# Patient Record
Sex: Male | Born: 1970 | Race: White | Hispanic: No | Marital: Single | Smoking: Former smoker
Health system: Southern US, Community
[De-identification: ages and names within clinical notes are randomized; demographics above are authoritative.]

## PROBLEM LIST (undated history)

## (undated) HISTORY — PX: HIP FRACTURE SURGERY: SHX118

## (undated) HISTORY — PX: ORIF FINGER / THUMB FRACTURE: SUR932

## (undated) HISTORY — PX: HERNIA REPAIR: SHX51

## (undated) HISTORY — PX: FRACTURE SURGERY: SHX138

---

## 2006-02-07 ENCOUNTER — Inpatient Hospital Stay (HOSPITAL_COMMUNITY): Admission: EM | Admit: 2006-02-07 | Discharge: 2006-02-09 | Payer: Self-pay | Admitting: Emergency Medicine

## 2007-02-22 ENCOUNTER — Encounter: Admission: RE | Admit: 2007-02-22 | Discharge: 2007-02-22 | Payer: Self-pay | Admitting: Family Medicine

## 2007-03-20 ENCOUNTER — Inpatient Hospital Stay (HOSPITAL_COMMUNITY): Admission: AC | Admit: 2007-03-20 | Discharge: 2007-03-27 | Payer: Self-pay

## 2007-04-23 ENCOUNTER — Ambulatory Visit (HOSPITAL_COMMUNITY): Admission: RE | Admit: 2007-04-23 | Discharge: 2007-04-23 | Payer: Self-pay | Admitting: Neurosurgery
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
## 2009-03-25 IMAGING — CR DG CLAVICLE*L*
2 series · 2 of 2 positions shown · non-contrast
Comparison: none

CLINICAL DATA: ORIF left clavicle. 
 LEFT CLAVICLE ? 2 VIEW:

[view not recorded (1 of 2)]
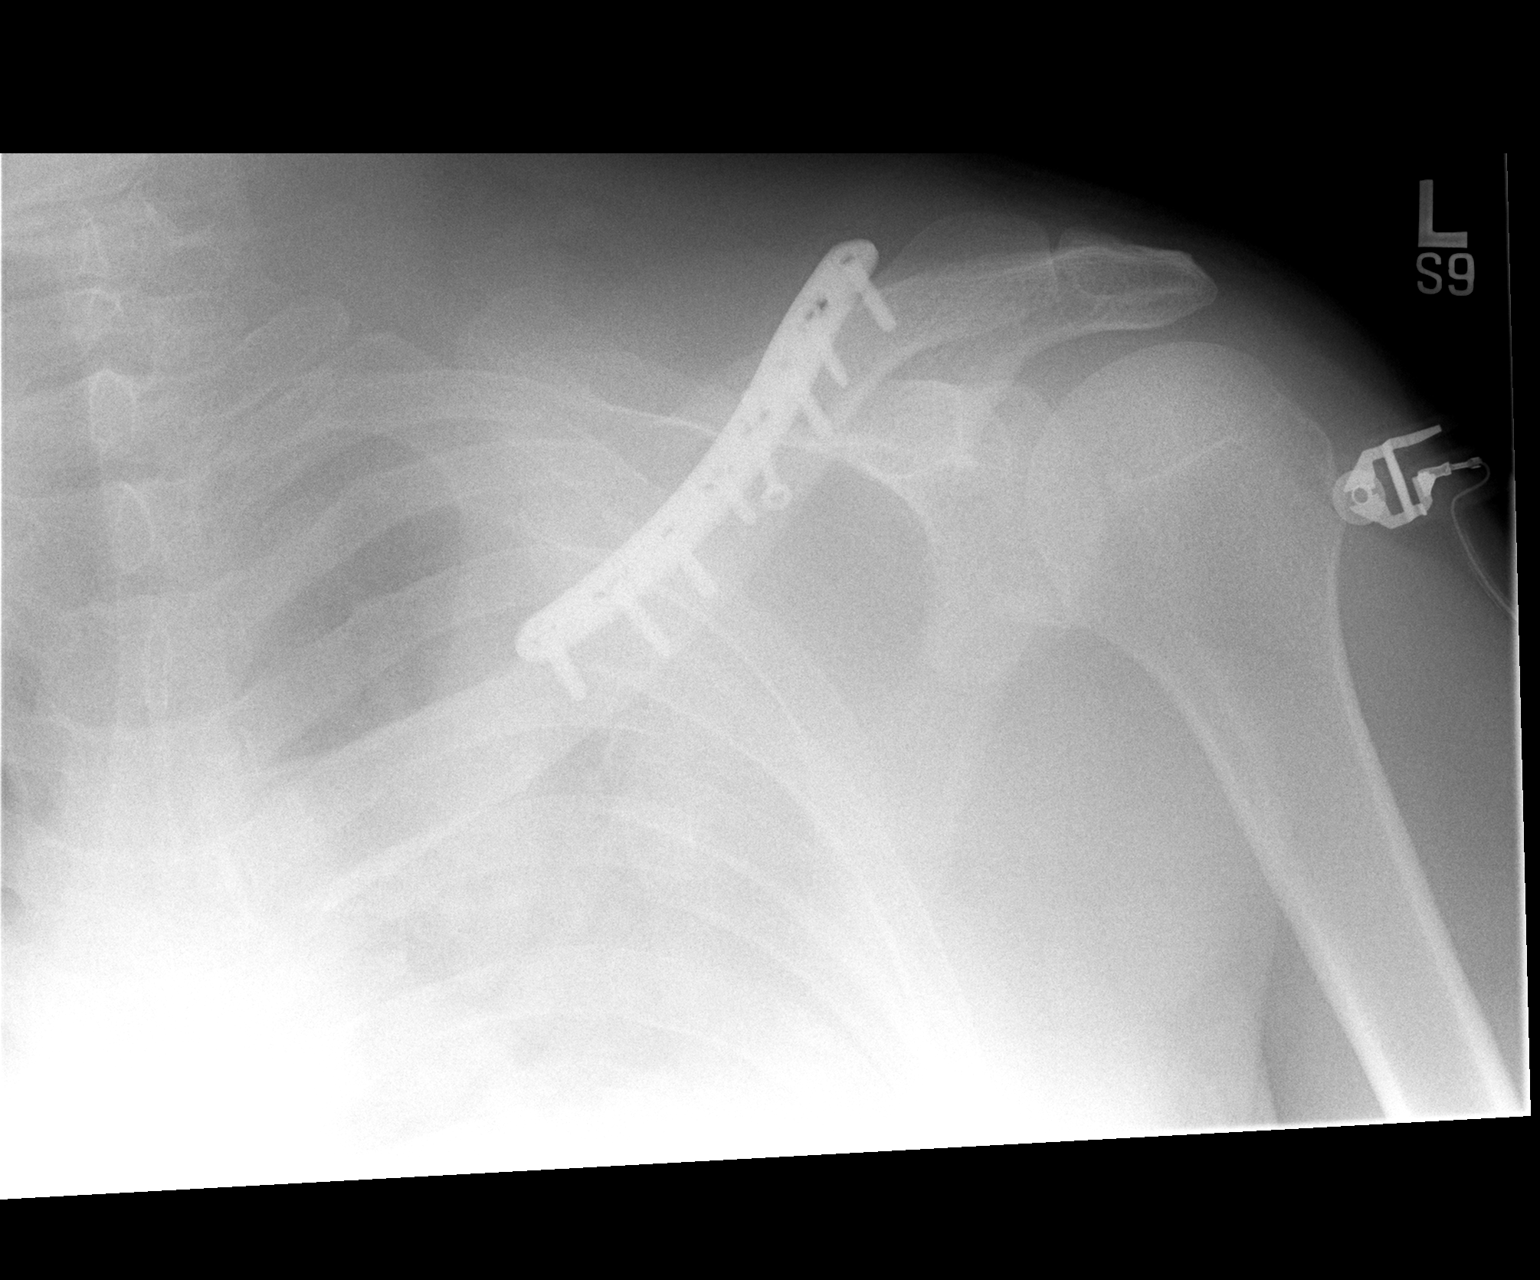

[view not recorded (2 of 2)]
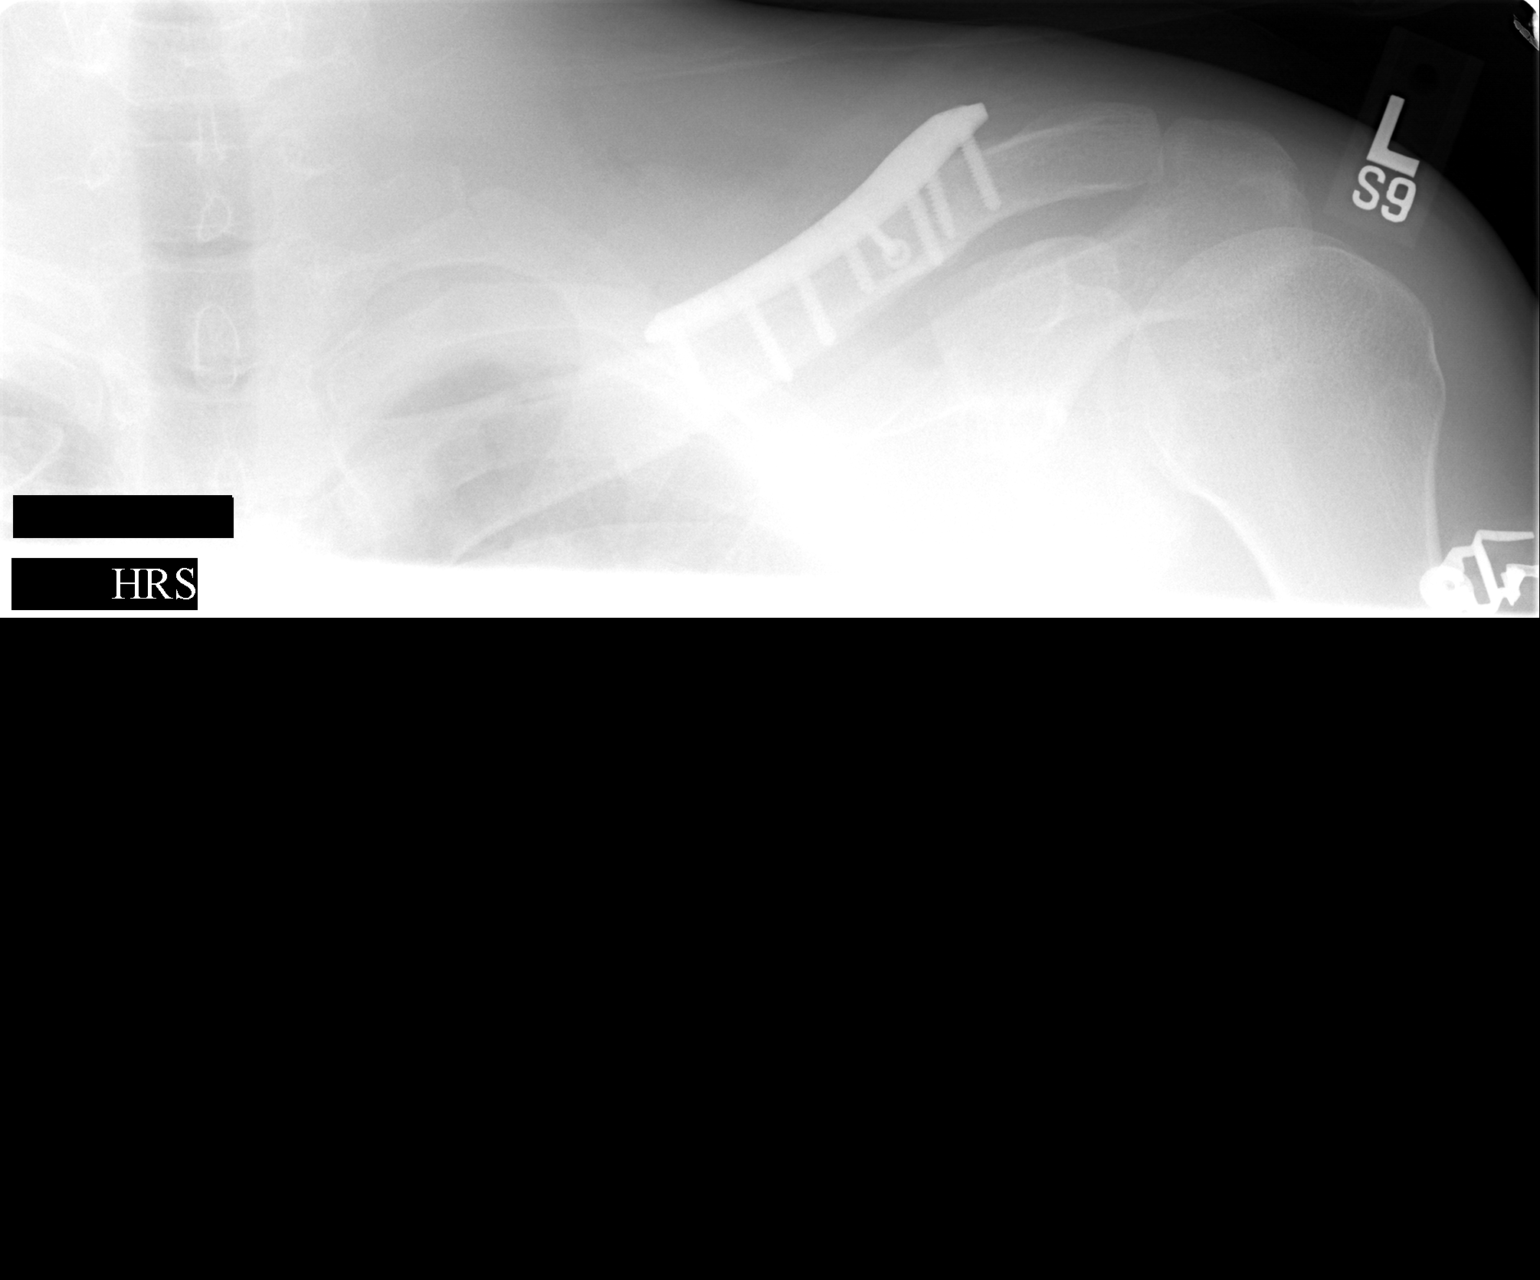

[2 of 2 positions shown; findings below may reference images not displayed]

FINDINGS: The patient has had ORIF with plate and screw fixation of a clavicle fracture.   Alignment appears anatomic.  No complicating features.
IMPRESSION: As discussed above.

## 2009-03-25 IMAGING — RF DG CLAVICLE*L*
1 series · 3 of 3 positions shown · non-contrast
Comparison: none

CLINICAL DATA: Left clavicle fracture. 
 LEFT CLAVICLE -3 VIEW:

[Series 1: run · 3 of 3 slices shown]
[im 1/3]
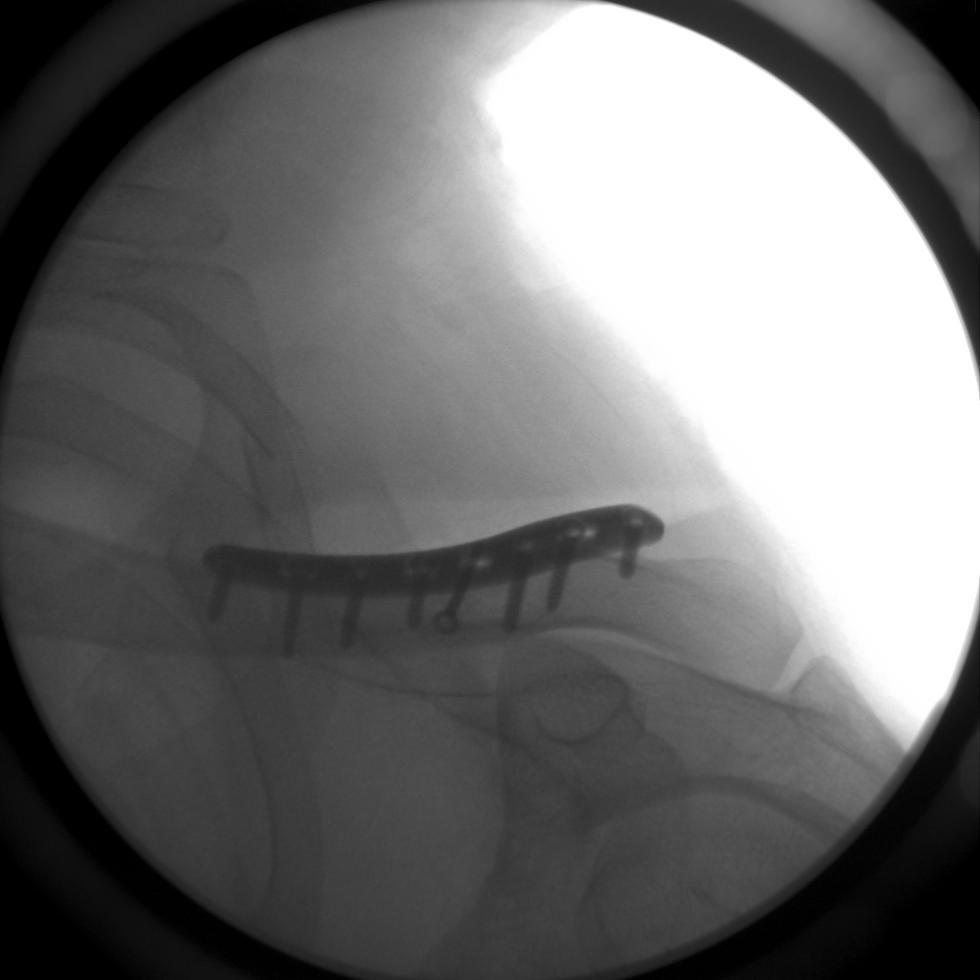
[im 2/3]
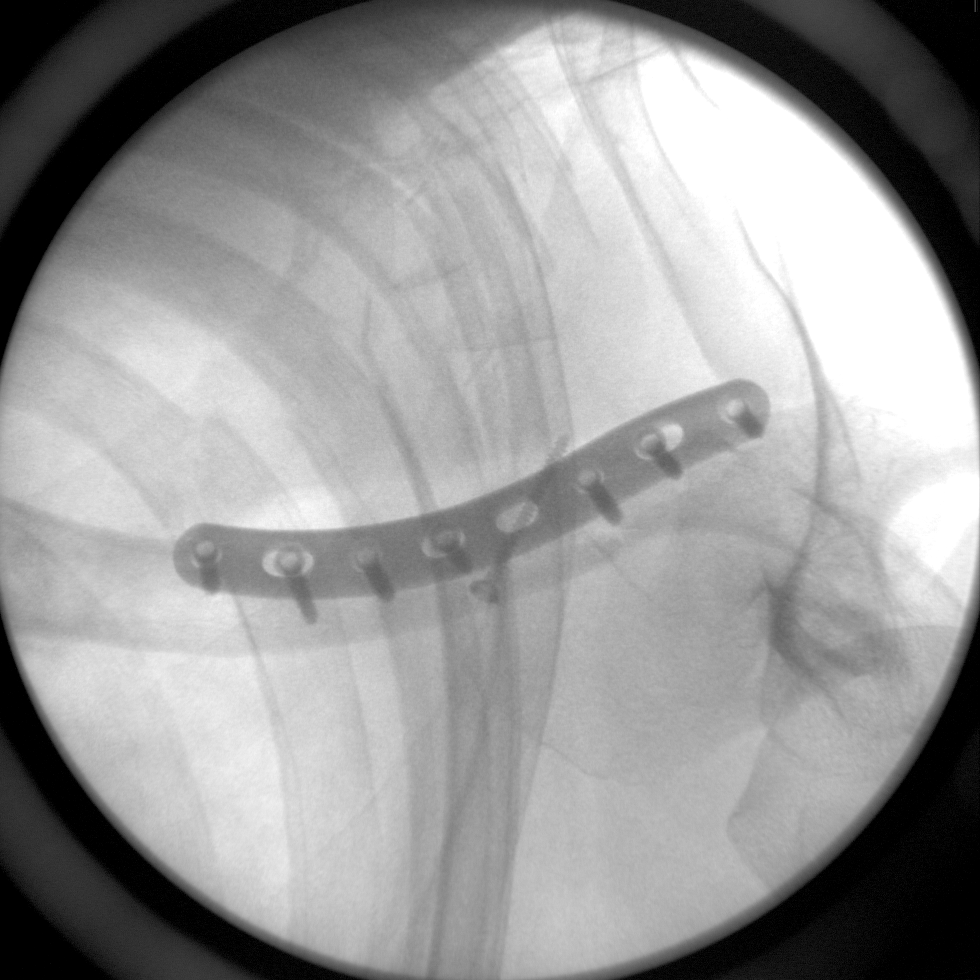
[im 3/3]
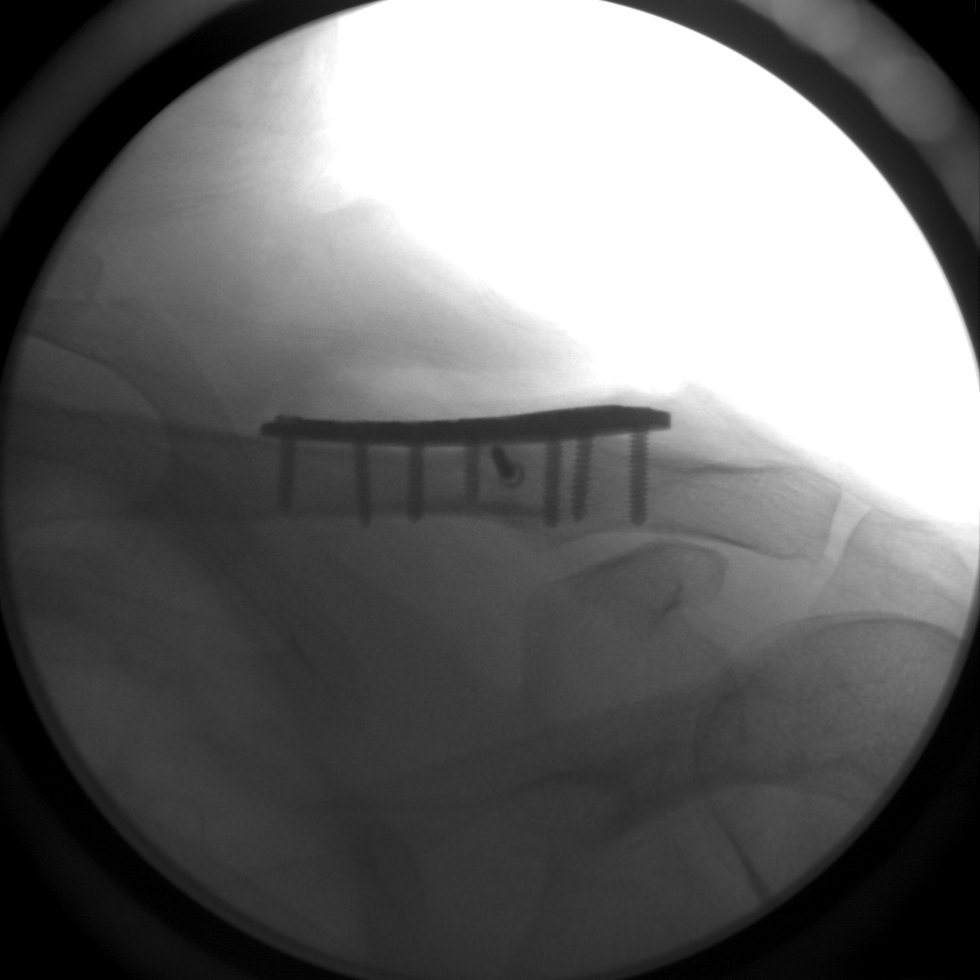

[3 of 3 positions shown; findings below may reference images not displayed]

FINDINGS: Fluoroscopic spot images obtained intraoperatively show placement of a fixation plate and screws across a distal clavicle fracture which is now in anatomic alignment.  
 Several left upper rib fractures are again noted.
IMPRESSION: Status post internal fixation of left clavicle fracture in anatomic alignment.

## 2010-08-12 ENCOUNTER — Encounter: Payer: Self-pay | Admitting: Neurosurgery
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
## 2010-12-03 NOTE — Op Note (Signed)
[name], [name]                ACCOUNT NO.:  [id number]   MEDICAL RECORD NO.:  [id number]          PATIENT TYPE:  INP   LOCATION:  5039                         FACILITY:  [facility]   PHYSICIAN:  Doralee Albino. Carola Frost, M.D. DATE OF BIRTH:  19-Aug-1970   DATE OF PROCEDURE:  03/25/2007  DATE OF DISCHARGE:                               OPERATIVE REPORT   PREOPERATIVE DIAGNOSIS:  Left clavicle fracture.   POSTOPERATIVE DIAGNOSIS:  Left clavicle fracture.   PROCEDURE:  Open reduction and internal fixation of left clavicle using  an Acumed plate.   SURGEON:  Doralee Albino. Carola Frost, M.D.   ASSISTANT:  None.   ANESTHESIA:  General.   SPECIMENS:  None.   ESTIMATED BLOOD LOSS:  Minimal.   COMPLICATIONS:  None.   DISPOSITION:  To the PACU.   CONDITION:  Stable.   INDICATIONS FOR PROCEDURE:  Seth [name] is a 40 year old male who  sustained a severely displaced left clavicle fracture with over 2 cm of  shortening.  We discussed preoperatively the risks and benefits of  surgery including the possibility of infection, nerve injury, vessel  injury, nonunion, symptomatic hardware, DVT, PE and others.  After a  full discussion, the patient wished to proceed.   DESCRIPTION OF PROCEDURE:  Mr. Laverdure was taken to the operating room.  After the administration of IV antibiotics, his left upper extremity was  prepped and draped in the usual sterile fashion around the clavicular  area.  A standard anterior approach was made and the muscle divided  while maintaining the periosteal layer of the clavicle. The fractured  ends were cleaned with curet and lavage.  There was significant  posterior translation of the distal segment as well as inferior  displacement and shortening.  We were able to pull this out to length  and hold it reduced with a pointed tenaculum. Multiple fluoro images  showed appropriate reduction.  There were no major butterfly segments  requiring dedicated internal fixation.  We then  placed an anterior to  posterior lag screw over drilling the near cortex.  We obtained  excellent compression of the fracture site, removed the tenaculum, and  proceeded with neutralization plating obtaining two locked and one  standard lateral bicortical screw and four medial ones.  Final AP and  lateral images showed appropriate reduction, hardware placement, and  length.  The wound was irrigated and closed in standard layered fashion  with 0 Vicryl for the deep fascial layer, 2-0 Vicryl for the subcu, and  running 3-0 Prolene and Steri-Strips for the skin.  A sterile gently  compressive dressing was applied and a sling placed, as well.  The  patient was taken to the PACU in stable condition.   PROGNOSIS:  Mr. Tanimoto should do well following his left clavicle repair.  He did have a traumatic abrasion in the area which was debrided at the  conclusion of the procedure as it began to peel up on the edges and it  is possible this may slightly increase his risk of infection, but again  should be minimal as it was thoroughly prepped and  debridement performed  under sterile conditions, as well.  He will be allowed early active and  passive range of motion of the shoulder at the side and can wean from  the sling after 3-4 weeks.  We do not anticipate any displacement of the  scapula.      Doralee Albino. Carola Frost, M.D.  Electronically Signed     MHH/MEDQ  D:  03/25/2007  T:  03/25/2007  Job:  [id number]
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
## 2011-05-02 LAB — CBC
HCT: 38.8 — ABNORMAL LOW
Hemoglobin: 14.4
Hemoglobin: 13.4
Hemoglobin: 15.3
MCHC: 35
MCV: 85.6
Platelets: 276
RBC: 4.46
RBC: 5.18
RDW: 13.2
WBC: 9.6
WBC: 9.7

## 2011-05-02 LAB — PROTIME-INR
INR: 1
Prothrombin Time: 13.7

## 2011-05-02 LAB — I-STAT 8, (EC8 V) (CONVERTED LAB)
Acid-base deficit: 5 — ABNORMAL HIGH
Bicarbonate: 21.4
Glucose, Bld: 138 — ABNORMAL HIGH
Hemoglobin: 16
Potassium: 3.6
Sodium: 136
TCO2: 23

## 2011-05-02 LAB — BASIC METABOLIC PANEL
BUN: 9
CO2: 33 — ABNORMAL HIGH
Calcium: 9.1
Chloride: 104
Creatinine, Ser: 0.78
GFR calc non Af Amer: >60
GFR calc non Af Amer: >60
Glucose, Bld: 108 — ABNORMAL HIGH
Potassium: 4.1
Sodium: 139
Sodium: 135

## 2011-05-02 LAB — SAMPLE TO BLOOD BANK

## 2011-09-01 ENCOUNTER — Ambulatory Visit (INDEPENDENT_AMBULATORY_CARE_PROVIDER_SITE_OTHER): Payer: BC Managed Care – PPO | Admitting: Physician Assistant

## 2011-09-01 VITALS — BP 125/68 | HR 78 | Temp 98.6°F | Resp 18 | Ht 73.0 in | Wt 205.0 lb

## 2011-09-01 DIAGNOSIS — J029 Acute pharyngitis, unspecified: Secondary | ICD-10-CM

## 2011-09-01 NOTE — Patient Instructions (Signed)
Upper Respiratory Infection, Adult An upper respiratory infection (URI) is also known as the common cold. It is often caused by a type of germ (virus). Colds are easily spread (contagious). You can pass it to others by kissing, coughing, sneezing, or drinking out of the same glass. Usually, you get better in 1 or 2 weeks.  HOME CARE   Only take medicine as told by your doctor.   Use a warm mist humidifier or breathe in steam from a hot shower.   Drink enough water and fluids to keep your pee (urine) clear or pale yellow.   Get plenty of rest.   Return to work when your temperature is back to normal or as told by your doctor. You may use a face mask and wash your hands to stop your cold from spreading.  GET HELP RIGHT AWAY IF:   After the first few days, you feel you are getting worse.   You have questions about your medicine.   You have chills, shortness of breath, or brown or red spit (mucus).   You have yellow or brown snot (nasal discharge) or pain in the face, especially when you bend forward.   You have a fever, puffy (swollen) neck, pain when you swallow, or white spots in the back of your throat.   You have a bad headache, ear pain, sinus pain, or chest pain.   You have a high-pitched whistling sound when you breathe in and out (wheezing).   You have a lasting cough or cough up blood.   You have sore muscles or a stiff neck.  MAKE SURE YOU:   Understand these instructions.   Will watch your condition.   Will get help right away if you are not doing well or get worse.  Document Released: 12/24/2007 Document Revised: 03/19/2011 Document Reviewed: 11/11/2010 [facility] Patient Information 2012 [place], [place].  Take Zinc every three hours until well (likely 36 hours) as well as Vitamin C 1000 mg twice daily.  Mucinex 1200mg  twice daily to thin mucous.  Drink plenty of fluids and rest as much as possible.

## 2011-09-01 NOTE — Progress Notes (Signed)
  Subjective:    Patient ID: Seth [name], male    DOB: 04-19-1971, 41 y.o.   MRN: [id number]  HPI Acute onset URI symptoms this afternoon.   Pressure feeling in head, "not over the top sinus".  Last sinusitis two years ago Difficulty concentrating d/t feeling "foggy headed" Tight feeling in throat, "right on the edge of illness". Slight nausea over the last hour.   Pressure left maxillary sinus x 6 hours. Not yet using OTC medications.  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  Loves it, but lots of work.    Review of Systems  Constitutional: Positive for fatigue. Negative for fever and chills.  HENT: Positive for ear pain, congestion, sore throat and sinus pressure. Negative for facial swelling, rhinorrhea, neck stiffness and postnasal drip.   Respiratory: Positive for cough (and clearing throat, started several hours ago). Negative for shortness of breath and wheezing.   Gastrointestinal: Positive for nausea (acute, while waiting in the lobby).  Musculoskeletal: Negative for myalgias and arthralgias.       Objective:   Physical Exam  Constitutional: He appears well-developed and well-nourished. No distress.       Does not look ill  HENT:  Head: Normocephalic and atraumatic.  Right Ear: External ear normal.  Left Ear: External ear normal.  Mouth/Throat: Oropharynx is clear and moist. No oropharyngeal exudate.  Eyes: EOM are normal. Pupils are equal, round, and reactive to light.  Neck: Normal range of motion. Neck supple.  Cardiovascular: Normal rate and regular rhythm.  Exam reveals no gallop and no friction rub.   No murmur heard. Pulmonary/Chest: Effort normal and breath sounds normal. No respiratory distress.  Lymphadenopathy:    He has no cervical adenopathy.          Assessment & Plan:  URI. Discussed at length with patient that all of his symptoms can be attributed to his viral illness.  Advised he use Mucinex 1200mg  BID to thin mucus as  well as drinking plenty of fluids and getting rest.  Due to acuity of his onset of illness, advised he use Zinc every three hours until symptoms abate, as well as vitamin C 1000mg  twice daily.  He may use ibuprofen or Tylenol prn aches or fever if they occur.   Reassurance that he will get better and does not need antibiotics.

## 2011-12-22 ENCOUNTER — Ambulatory Visit: Payer: BC Managed Care – PPO

## 2011-12-22 ENCOUNTER — Ambulatory Visit (INDEPENDENT_AMBULATORY_CARE_PROVIDER_SITE_OTHER): Payer: BC Managed Care – PPO | Admitting: Family Medicine

## 2011-12-22 VITALS — BP 152/83 | HR 60 | Temp 98.2°F | Resp 16 | Ht 74.5 in | Wt 203.0 lb

## 2011-12-22 DIAGNOSIS — R519 Headache, unspecified: Secondary | ICD-10-CM

## 2011-12-22 DIAGNOSIS — R51 Headache: Secondary | ICD-10-CM

## 2011-12-22 DIAGNOSIS — R040 Epistaxis: Secondary | ICD-10-CM

## 2011-12-22 MED ORDER — TRAMADOL HCL 50 MG PO TABS: 50.0000 mg | ORAL_TABLET | Freq: Three times a day (TID) | ORAL | Status: AC | PRN

## 2011-12-22 NOTE — Patient Instructions (Signed)
Hematoma   A hematoma is a pocket of blood that collects under the skin, in an organ, in a body space, in a joint space, or in other tissue. The blood can clot to form a lump that you can see and feel. The lump is often firm, sore, and sometimes even painful and tender. Most hematomas get better in a few days to weeks. However, some hematomas may be serious and require medical care. Hematomas can range in size from very small to very large.   CAUSES   A hematoma can be caused by a blunt or penetrating injury. It can also be caused by leakage from a blood vessel under the skin. Spontaneous leakage from a blood vessel is more likely to occur in elderly people, especially those taking blood thinners. Sometimes, a hematoma can develop after certain medical procedures.   SYMPTOMS   Unlike a bruise, a hematoma forms a firm lump that you can feel. This lump is the collection of blood. The collection of blood can also cause your skin to turn a blue to dark blue color. If the hematoma is close to the surface of the skin, it often produces a yellowish color in the skin.   DIAGNOSIS   Your caregiver can determine whether you have a hematoma based on your history and a physical exam.   TREATMENT   Hematomas usually go away on their own over time. Rarely does the blood need to be drained out of the body.   HOME CARE INSTRUCTIONS   Put ice on the injured area.   Put ice in a plastic bag.   Place a towel between your skin and the bag.   Leave the ice on for 15 to 20 minutes, 3 to 4 times a day for the first 1 to 2 days.   After the first 2 days, switch to using warm compresses on the hematoma.   Elevate the injured area to help decrease pain and swelling. Wrapping the area with an elastic bandage may also be helpful. Compression helps to reduce swelling and promotes shrinking of the hematoma. Make sure the bandage is not wrapped too tight.   If your hematoma is on a lower extremity and is painful, crutches may be helpful for a  couple days.   Only take over-the-counter or prescription medicines for pain, discomfort, or fever as directed by your caregiver. Most patients can take acetaminophen or ibuprofen for the pain.   SEEK IMMEDIATE MEDICAL CARE IF:   You have increasing pain, or your pain is not controlled with medicine.   You have a fever.   You have worsening swelling or discoloration.   Your skin over the hematoma breaks or starts bleeding.   MAKE SURE YOU:   Understand these instructions.   Will watch your condition.   Will get help right away if you are not doing well or get worse.   Document Released: 02/19/2004 Document Revised: 06/26/2011 Document Reviewed: 03/10/2011   [employer]® Patient Information ©2012 [employer].
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
## 2011-12-30 ENCOUNTER — Telehealth: Payer: Self-pay

## 2011-12-30 NOTE — Telephone Encounter (Signed)
Pt has questions about his last office visit and when he can resume normal activities
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
## 2011-12-30 NOTE — Telephone Encounter (Signed)
Pt.notified
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
## 2012-01-02 ENCOUNTER — Ambulatory Visit (INDEPENDENT_AMBULATORY_CARE_PROVIDER_SITE_OTHER): Payer: BC Managed Care – PPO | Admitting: Emergency Medicine

## 2012-01-02 VITALS — BP 124/75 | HR 79 | Temp 99.0°F | Resp 17 | Ht 74.5 in | Wt 198.0 lb

## 2012-01-02 DIAGNOSIS — J018 Other acute sinusitis: Secondary | ICD-10-CM

## 2012-01-02 DIAGNOSIS — J40 Bronchitis, not specified as acute or chronic: Secondary | ICD-10-CM

## 2012-01-02 DIAGNOSIS — J029 Acute pharyngitis, unspecified: Secondary | ICD-10-CM

## 2012-01-02 MED ORDER — AMOXICILLIN-POT CLAVULANATE ER 1000-62.5 MG PO TB12: 2.0000 | ORAL_TABLET | Freq: Two times a day (BID) | ORAL | Status: AC

## 2012-01-02 MED ORDER — PSEUDOEPHEDRINE-GUAIFENESIN ER 120-1200 MG PO TB12: 1.0000 | ORAL_TABLET | Freq: Two times a day (BID) | ORAL | Status: DC

## 2012-01-02 MED ORDER — HYDROCOD POLST-CHLORPHEN POLST 10-8 MG/5ML PO LQCR: 5.0000 mL | Freq: Two times a day (BID) | ORAL | Status: DC | PRN

## 2012-01-02 NOTE — Patient Instructions (Signed)

## 2012-01-02 NOTE — Progress Notes (Signed)
  Subjective:    Patient ID: Lean Fayson, male    DOB: 1970-08-14, 41 y.o.   MRN: [id number]  HPI    Review of Systems     Objective:   Physical Exam        Assessment & Plan:   Subjective:     Mamie Diiorio is a 41 y.o. male who presents for evaluation of sinus pain. Symptoms include: congestion, epistaxis, foul breath, frequent clearing of the throat, headaches, nasal congestion, purulent rhinorrhea, sinus pressure and sore throat. Onset of symptoms was 1 week ago. Symptoms have been gradually worsening since that time. Past history is significant for no history of pneumonia or bronchitis. Patient is a non-smoker.  The following portions of the patient's history were reviewed and updated as appropriate: allergies, current medications, past family history, past medical history, past social history, past surgical history and problem list.  Review of Systems A comprehensive review of systems was negative.   Objective:    BP 124/75  Pulse 79  Temp 99 F (37.2 C) (Oral)  Resp 17  Ht 6' 2.5" (1.892 m)  Wt 198 lb (89.812 kg)  BMI 25.08 kg/m2  SpO2 95%  General Appearance:    Alert, cooperative, no distress, appears stated age  Head:    Normocephalic, without obvious abnormality, atraumatic  Eyes:    PERRL, conjunctiva/corneas clear, EOM's intact, fundi    benign, both eyes       Ears:    Normal TM's and external ear canals, both ears  Nose:   Nares normal, septum midline, mucosa normal, no drainage    But moderate sinus tenderness  Throat:   Lips, mucosa, and tongue normal; teeth and gums normal  Neck:   Supple, symmetrical, trachea midline, no adenopathy;       thyroid:  No enlargement/tenderness/nodules; no carotid   bruit or JVD  Back:     Symmetric, no curvature, ROM normal, no CVA tenderness  Lungs:     Clear to auscultation bilaterally, respirations unlabored  Chest wall:    No tenderness or deformity  Heart:    Regular rate and rhythm, S1 and S2 normal, no  murmur, rub   or gallop  Abdomen:     Soft, non-tender, bowel sounds active all four quadrants,    no masses, no organomegaly  Genitalia:    Normal male without lesion, discharge or tenderness  Rectal:    Normal tone, normal prostate, no masses or tenderness;   guaiac negative stool  Extremities:   Extremities normal, atraumatic, no cyanosis or edema  Pulses:   2+ and symmetric all extremities  Skin:   Skin color, texture, turgor normal, no rashes or lesions  Lymph nodes:   Cervical, supraclavicular, and axillary nodes normal  Neurologic:   CNII-XII intact. Normal strength, sensation and reflexes      throughout      Assessment:    Acute bacterial sinusitis.    Plan:    Antihistamines per medication orders. Augmentin per medication orders. Follow up in 10 days or as needed.

## 2013-05-31 ENCOUNTER — Ambulatory Visit (INDEPENDENT_AMBULATORY_CARE_PROVIDER_SITE_OTHER): Payer: BC Managed Care – PPO | Admitting: Family Medicine

## 2013-05-31 VITALS — BP 109/74 | HR 81 | Temp 98.6°F | Resp 18 | Ht 74.0 in | Wt 226.0 lb

## 2013-05-31 DIAGNOSIS — R1310 Dysphagia, unspecified: Secondary | ICD-10-CM

## 2013-05-31 DIAGNOSIS — M542 Cervicalgia: Secondary | ICD-10-CM

## 2013-05-31 MED ORDER — PREDNISONE 20 MG PO TABS: ORAL_TABLET | ORAL | Status: DC
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
## 2013-06-01 NOTE — Progress Notes (Signed)
Left message for patient to call back regarding scheduling appointment for physical. °

## 2013-06-01 NOTE — Progress Notes (Signed)
Appointment made for 12/26.

## 2013-06-08 ENCOUNTER — Telehealth: Payer: Self-pay

## 2013-06-08 NOTE — Telephone Encounter (Signed)
Patient states prednisone not working and it is still painful. Would like to know what Dr. Elbert Ewings recommends.  Best [phone]

## 2013-06-09 ENCOUNTER — Other Ambulatory Visit: Payer: Self-pay | Admitting: Family Medicine

## 2013-06-09 DIAGNOSIS — M542 Cervicalgia: Secondary | ICD-10-CM

## 2013-07-15 ENCOUNTER — Encounter: Payer: BC Managed Care – PPO | Admitting: Family Medicine

## 2013-07-21 ENCOUNTER — Ambulatory Visit (INDEPENDENT_AMBULATORY_CARE_PROVIDER_SITE_OTHER): Payer: BC Managed Care – PPO | Admitting: Physician Assistant

## 2013-07-21 VITALS — BP 128/70 | HR 103 | Temp 99.4°F | Resp 16 | Ht 74.5 in | Wt 232.0 lb

## 2013-07-21 DIAGNOSIS — R05 Cough: Secondary | ICD-10-CM

## 2013-07-21 DIAGNOSIS — J101 Influenza due to other identified influenza virus with other respiratory manifestations: Secondary | ICD-10-CM

## 2013-07-21 DIAGNOSIS — J029 Acute pharyngitis, unspecified: Secondary | ICD-10-CM

## 2013-07-21 DIAGNOSIS — R059 Cough, unspecified: Secondary | ICD-10-CM

## 2013-07-21 DIAGNOSIS — J111 Influenza due to unidentified influenza virus with other respiratory manifestations: Secondary | ICD-10-CM

## 2013-07-21 LAB — POCT INFLUENZA A/B
INFLUENZA A, POC: POSITIVE
Influenza B, POC: NEGATIVE

## 2013-07-21 MED ORDER — HYDROCOD POLST-CHLORPHEN POLST 10-8 MG/5ML PO LQCR: 5.0000 mL | Freq: Two times a day (BID) | ORAL | Status: DC | PRN

## 2013-07-21 MED ORDER — IPRATROPIUM BROMIDE 0.03 % NA SOLN: 2.0000 | Freq: Two times a day (BID) | NASAL | Status: DC

## 2013-07-21 MED ORDER — GUAIFENESIN ER 1200 MG PO TB12: 1.0000 | ORAL_TABLET | Freq: Two times a day (BID) | ORAL | Status: DC | PRN

## 2013-07-21 MED ORDER — OSELTAMIVIR PHOSPHATE 75 MG PO CAPS: 75.0000 mg | ORAL_CAPSULE | Freq: Two times a day (BID) | ORAL | Status: AC

## 2013-07-21 NOTE — Patient Instructions (Signed)

## 2013-07-21 NOTE — Progress Notes (Signed)
   Subjective:    Patient ID: Seth [name], male    DOB: [birth year], 43 y.o.   MRN: [id number]  PCP: No primary provider on file.  Chief Complaint  Patient presents with  . Sore Throat    x 2 day  . URI   Medications, allergies, past medical history, surgical history, family history, social history and problem list reviewed and updated.  HPI  Had a flu vaccine this season.  Symptoms began 2 nights ago. 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  Felt a tickle in his throat and had an aggravating cough.  Cough kept him awake.  Has persisted since then.  Nasal congestion. Felt some frontal sinus pressure, but not as significant as previous sinusitis. No fever/chills. Has a back injury from time in the [employer], and is sore since he's been in bed x 2 days. Isn't sure if he feels "all body" aches.  No nausea, vomiting or diarrhea.  No ear symptoms.  Review of Systems As above.    Objective:   Physical Exam  Blood pressure 128/70, pulse 103, temperature 99.4 F (37.4 C), temperature source Oral, resp. rate 16, height 6' 2.5" (1.892 m), weight 232 lb (105.235 kg), SpO2 95.00%. Body mass index is 29.4 kg/(m^2). Well-developed, well nourished WM who is awake, alert and oriented, in NAD. Coughing frequently during interview/exam. HEENT: [place]/AT, PERRL, EOMI.  Sclera and conjunctiva are clear.  EAC are patent, TMs are normal in appearance. Nasal mucosa is pink and moist. OP is clear. Neck: supple, non-tender, no lymphadenopathy, thyromegaly. Heart: RRR, no murmur Lungs: normal effort, CTA Extremities: no cyanosis, clubbing or edema. Skin: warm and dry without rash. Psychologic: good mood and appropriate affect, normal speech and behavior.   Results for orders placed in visit on 07/21/13  POCT INFLUENZA A/B      Result Value Range   Influenza A, POC Positive     Influenza B, POC Negative         Assessment & Plan:  1. Influenza  A 2. Cough 3. Acute pharyngitis - POCT Influenza A/B - oseltamivir (TAMIFLU) 75 MG capsule; Take 1 capsule (75 mg total) by mouth 2 (two) times daily.  Dispense: 10 capsule; Refill: 0 - ipratropium (ATROVENT) 0.03 % nasal spray; Place 2 sprays into both nostrils 2 (two) times daily.  Dispense: 30 mL; Refill: 0 - Guaifenesin (MUCINEX MAXIMUM STRENGTH) 1200 MG TB12; Take 1 tablet (1,200 mg total) by mouth every 12 (twelve) hours as needed.  Dispense: 14 tablet; Refill: 1 - chlorpheniramine-HYDROcodone (TUSSIONEX PENNKINETIC ER) 10-8 MG/5ML LQCR; Take 5 mLs by mouth every 12 (twelve) hours as needed for cough (cough).  Dispense: 100 mL; Refill: 0  Supportive care.  Anticipatory guidance.  RTC if symptoms worsen/persist.   Fara Chute, PA-C Physician Assistant-Certified [facility]

## 2014-09-11 ENCOUNTER — Ambulatory Visit (INDEPENDENT_AMBULATORY_CARE_PROVIDER_SITE_OTHER): Payer: BLUE CROSS/BLUE SHIELD | Admitting: Physician Assistant

## 2014-09-11 VITALS — BP 150/82 | HR 96 | Temp 99.7°F | Resp 16 | Ht 74.25 in | Wt 225.0 lb

## 2014-09-11 DIAGNOSIS — J101 Influenza due to other identified influenza virus with other respiratory manifestations: Secondary | ICD-10-CM

## 2014-09-11 DIAGNOSIS — R05 Cough: Secondary | ICD-10-CM

## 2014-09-11 DIAGNOSIS — R059 Cough, unspecified: Secondary | ICD-10-CM

## 2014-09-11 LAB — POCT INFLUENZA A/B
Influenza A, POC: POSITIVE
Influenza B, POC: NEGATIVE

## 2014-09-11 MED ORDER — OSELTAMIVIR PHOSPHATE 75 MG PO CAPS: 75.0000 mg | ORAL_CAPSULE | Freq: Two times a day (BID) | ORAL | Status: DC

## 2014-09-11 MED ORDER — HYDROCOD POLST-CHLORPHEN POLST 10-8 MG/5ML PO LQCR: 5.0000 mL | Freq: Two times a day (BID) | ORAL | Status: DC | PRN

## 2014-09-11 MED ORDER — IPRATROPIUM BROMIDE 0.03 % NA SOLN: 2.0000 | Freq: Two times a day (BID) | NASAL | Status: DC

## 2014-09-11 MED ORDER — GUAIFENESIN ER 1200 MG PO TB12: 1.0000 | ORAL_TABLET | Freq: Two times a day (BID) | ORAL | Status: DC | PRN

## 2014-09-11 NOTE — Patient Instructions (Signed)

## 2014-09-11 NOTE — Progress Notes (Signed)
Subjective:    Patient ID: Seth [name], male    DOB: 1971-02-21, 44 y.o.   MRN: [id number]   PCP: No PCP Per Patient  Chief Complaint  Patient presents with  . Sore Throat    x 3 days   . Sinusitis  . Nasal Congestion  . Headache  . Cough    No Known Allergies  There are no active problems to display for this patient.   Prior to Admission medications   Not on File    Medical, Surgical, Family and Social History reviewed and updated.  HPI  Presents with 3 days of illness including sore throat, congestion, facial pressure and pain, Headache and cough. Feels achy all over, fidgety, like he's unable to be still. Poor sleep.  No nausea or vomiting, no diarrhea. No urinary symptoms.  He isn't sure if he received the flu vaccine this season. His last visit here was 07/21/13, when he had influenza A.  Review of Systems As above.    Objective:   Physical Exam  Constitutional: He is oriented to person, place, and time. Vital signs are normal. He appears well-developed and well-nourished. He is active and cooperative. He appears ill. No distress.  BP 150/82 mmHg  Pulse 96  Temp(Src) 99.7 F (37.6 C) (Oral)  Resp 16  Ht 6' 2.25" (1.886 m)  Wt 225 lb (102.059 kg)  BMI 28.69 kg/m2  SpO2 97%  HENT:  Head: Normocephalic and atraumatic.  Right Ear: Hearing, tympanic membrane, external ear and ear canal normal.  Left Ear: Hearing, tympanic membrane, external ear and ear canal normal.  Nose: Mucosal edema (mild; red) present.  Mouth/Throat: Uvula is midline, oropharynx is clear and moist and mucous membranes are normal.  Eyes: Conjunctivae, EOM and lids are normal. Pupils are equal, round, and reactive to light. No scleral icterus.  Neck: Trachea normal and normal range of motion. Neck supple. No thyroid mass and no thyromegaly present.  Cardiovascular: Normal rate, regular rhythm and normal heart sounds.   Pulses:      Radial pulses are 2+ on the right side, and 2+ on  the left side.  Pulmonary/Chest: Effort normal and breath sounds normal.  Lymphadenopathy:       Head (right side): No tonsillar, no preauricular, no posterior auricular and no occipital adenopathy present.       Head (left side): No tonsillar, no preauricular, no posterior auricular and no occipital adenopathy present.    He has no cervical adenopathy (but tender in the tonsilar region R>L).       Right: No supraclavicular adenopathy present.       Left: No supraclavicular adenopathy present.  Neurological: He is alert and oriented to person, place, and time. No sensory deficit.  Skin: Skin is warm, dry and intact. No rash noted. No cyanosis or erythema. Nails show no clubbing.  Psychiatric: He has a normal mood and affect. His speech is normal and behavior is normal.   Results for orders placed or performed in visit on 09/11/14  POCT Influenza A/B  Result Value Ref Range   Influenza A, POC Positive    Influenza B, POC Negative           Assessment & Plan:  1. Cough Secondary to #2 - POCT Influenza A/B  2. Influenza A Supportive care.  Anticipatory guidance.  RTC if symptoms worsen/persist. Work note to RTW Friday 2/26.  - ipratropium (ATROVENT) 0.03 % nasal spray; Place 2 sprays into both nostrils 2 (two)  times daily.  Dispense: 30 mL; Refill: 0 - Guaifenesin (MUCINEX MAXIMUM STRENGTH) 1200 MG TB12; Take 1 tablet (1,200 mg total) by mouth every 12 (twelve) hours as needed.  Dispense: 14 tablet; Refill: 1 - chlorpheniramine-HYDROcodone (TUSSIONEX PENNKINETIC ER) 10-8 MG/5ML LQCR; Take 5 mLs by mouth every 12 (twelve) hours as needed for cough (cough).  Dispense: 100 mL; Refill: 0 - oseltamivir (TAMIFLU) 75 MG capsule; Take 1 capsule (75 mg total) by mouth 2 (two) times daily.  Dispense: 10 capsule; Refill: 0   Fara Chute, PA-C Physician Assistant-Certified [facility]

## 2014-09-12 NOTE — Addendum Note (Signed)
Addended by: Fara Chute on: 09/12/2014 03:52 PM   Modules accepted: SmartSet

## 2014-09-12 NOTE — Progress Notes (Deleted)
   Subjective:    Patient ID: Seth [name], male    DOB: 26-Apr-1971, 44 y.o.   MRN: [id number]  HPI    Review of Systems     Objective:   Physical Exam        Assessment & Plan:

## 2014-09-12 NOTE — Addendum Note (Signed)
Addended by: Harrison Mons S on: 09/12/2014 02:00 PM   Modules accepted: SmartSet

## 2015-02-01 ENCOUNTER — Telehealth: Payer: Self-pay | Admitting: General Practice

## 2015-02-01 NOTE — Telephone Encounter (Signed)
Yes

## 2015-02-01 NOTE — Telephone Encounter (Signed)
Patient is requesting to establish with Dr. Jones.  Please advise.   °

## 2015-02-02 NOTE — Telephone Encounter (Signed)
Left voice mail to make an appointment to establish care.

## 2015-04-03 ENCOUNTER — Ambulatory Visit (INDEPENDENT_AMBULATORY_CARE_PROVIDER_SITE_OTHER): Payer: BLUE CROSS/BLUE SHIELD | Admitting: Family Medicine

## 2015-04-03 VITALS — BP 122/70 | HR 83 | Temp 98.1°F | Resp 16 | Ht 74.0 in | Wt 232.0 lb

## 2015-04-03 DIAGNOSIS — R1031 Right lower quadrant pain: Secondary | ICD-10-CM

## 2015-04-03 DIAGNOSIS — K4090 Unilateral inguinal hernia, without obstruction or gangrene, not specified as recurrent: Secondary | ICD-10-CM

## 2015-04-03 DIAGNOSIS — M899 Disorder of bone, unspecified: Secondary | ICD-10-CM | POA: Diagnosis not present

## 2015-04-03 NOTE — Patient Instructions (Signed)
Hernia  A hernia occurs when an internal organ pushes out through a weak spot in the abdominal wall. Hernias most commonly occur in the groin and around the navel. Hernias often can be pushed back into place (reduced). Most hernias tend to get worse over time. Some abdominal hernias can get stuck in the opening (irreducible or incarcerated hernia) and cannot be reduced. An irreducible abdominal hernia which is tightly squeezed into the opening is at risk for impaired blood supply (strangulated hernia). A strangulated hernia is a medical emergency. Because of the risk for an irreducible or strangulated hernia, surgery may be recommended to repair a hernia.  CAUSES    Heavy lifting.   Prolonged coughing.   Straining to have a bowel movement.   A cut (incision) made during an abdominal surgery.  HOME CARE INSTRUCTIONS    Bed rest is not required. You may continue your normal activities.   Avoid lifting more than 10 pounds (4.5 kg) or straining.   Cough gently. If you are a smoker it is best to stop. Even the best hernia repair can break down with the continual strain of coughing. Even if you do not have your hernia repaired, a cough will continue to aggravate the problem.   Do not wear anything tight over your hernia. Do not try to keep it in with an outside bandage or truss. These can damage abdominal contents if they are trapped within the hernia sac.   Eat a normal diet.   Avoid constipation. Straining over long periods of time will increase hernia size and encourage breakdown of repairs. If you cannot do this with diet alone, stool softeners may be used.  SEEK IMMEDIATE MEDICAL CARE IF:    You have a fever.   You develop increasing abdominal pain.   You feel nauseous or vomit.   Your hernia is stuck outside the abdomen, looks discolored, feels hard, or is tender.   You have any changes in your bowel habits or in the hernia that are unusual for you.   You have increased pain or swelling around the  hernia.   You cannot push the hernia back in place by applying gentle pressure while lying down.  MAKE SURE YOU:    Understand these instructions.   Will watch your condition.   Will get help right away if you are not doing well or get worse.  Document Released: 07/07/2005 Document Revised: 09/29/2011 Document Reviewed: 02/24/2008  [employer] Patient Information 2015 [employer]. This information is not intended to replace advice given to you by your health care provider. Make sure you discuss any questions you have with your health care provider.
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
## 2015-04-11 ENCOUNTER — Other Ambulatory Visit: Payer: Self-pay | Admitting: Surgery
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
## 2016-08-11 ENCOUNTER — Encounter (HOSPITAL_COMMUNITY): Payer: Self-pay | Admitting: *Deleted

## 2016-08-11 ENCOUNTER — Ambulatory Visit (HOSPITAL_COMMUNITY)
Admission: EM | Admit: 2016-08-11 | Discharge: 2016-08-11 | Disposition: A | Discharge status: Home / Self Care | Payer: BLUE CROSS/BLUE SHIELD | Attending: Family Medicine | Admitting: Family Medicine

## 2016-08-11 DIAGNOSIS — J029 Acute pharyngitis, unspecified: Secondary | ICD-10-CM | POA: Insufficient documentation

## 2016-08-11 DIAGNOSIS — R05 Cough: Secondary | ICD-10-CM | POA: Insufficient documentation

## 2016-08-11 LAB — POCT RAPID STREP A: Streptococcus, Group A Screen (Direct): NEGATIVE

## 2016-08-11 MED ORDER — PREDNISONE 10 MG (21) PO TBPK: ORAL_TABLET | ORAL | 0 refills | Status: DC

## 2016-08-11 MED ORDER — BENZONATATE 100 MG PO CAPS: 100.0000 mg | ORAL_CAPSULE | Freq: Three times a day (TID) | ORAL | 0 refills | Status: DC

## 2016-08-11 NOTE — ED Triage Notes (Addendum)
Patient reports cough and sore throat x 5 days, has been taking OTC meds without relief. No fever. No nasal congestion. No rash.

## 2016-08-11 NOTE — Discharge Instructions (Signed)
You most likely have viral pharyngitis. You have been tested for strep throat and are negative, the sample will be sent for culture to confirm. I have sent a steroid to your pharmacy that will help with the swelling in your throat. Take 6 tablets today then decrease by 1 each day till finished (6,5,4,3,2,1). I have also sent in a medicine for cough called Tessalon, take 1 tablet every 8 hours as needed. You may take tylenol every 4-6 hours for pain relief, or Chloraseptic throat spray or lozenges. Should your symptoms fail to resolve or worsen, follow up with your primary care provider or return to clinic.

## 2016-08-11 NOTE — ED Provider Notes (Signed)
CSN: [id number]     Arrival date & time 08/11/16  1123 History   None    Chief Complaint  Patient presents with  . Cough  . Sore Throat   (Consider location/radiation/quality/duration/timing/severity/associated sxs/prior Treatment) 46 year old male presents to clinic with 5 day history of sore throat and cough along with a hoarse voice. He denies fever, body aches, muscle aches, nausea, vomiting, or diarrhea. He has been treating his throat with throat lozenges without relief.    The history is provided by the patient.  Cough  Sore Throat     History reviewed. No pertinent past medical history. Past Surgical History:  Procedure Laterality Date  . FRACTURE SURGERY     multiple surgeries to repair multiple fractures  . HERNIA REPAIR     History reviewed. No pertinent family history. Social History  Substance Use Topics  . Smoking status: Former Smoker    Packs/day: 1.00    Years: 10.00    Types: Cigarettes    Quit date: 08/31/2010  . Smokeless tobacco: Never Used  . Alcohol use 0.0 oz/week    3 - 10 Cans of beer per week    Review of Systems  Reason unable to perform ROS: as covered in HPI.  Respiratory: Positive for cough.   All other systems reviewed and are negative.   Allergies  Patient has no known allergies.  Home Medications   Prior to Admission medications   Medication Sig Start Date End Date Taking? Authorizing Provider  benzonatate (TESSALON) 100 MG capsule Take 1 capsule (100 mg total) by mouth every 8 (eight) hours. 08/11/16   Barnet Glasgow, NP  predniSONE (STERAPRED UNI-PAK 21 TAB) 10 MG (21) TBPK tablet Take 6 tablets today, then decrease by 1 each day till finished [id number]) 08/11/16   Barnet Glasgow, NP   Meds Ordered and Administered this Visit  Medications - No data to display  BP 129/91 (BP Location: Left Arm)   Pulse 81   Temp 98.1 F (36.7 C) (Oral)   Resp 17   SpO2 100%  No data found.   Physical Exam  Constitutional: He  appears well-developed and well-nourished.  HENT:  Head: Normocephalic and atraumatic.  Right Ear: External ear normal.  Left Ear: External ear normal.  Nose: Nose normal.  Mouth/Throat: Uvula is midline and oropharynx is clear and moist. No oropharyngeal exudate, posterior oropharyngeal edema or posterior oropharyngeal erythema.  Eyes: Conjunctivae are normal. Pupils are equal, round, and reactive to light.  Neck: Neck supple.  Cardiovascular: Normal rate and regular rhythm.   No murmur heard. Pulmonary/Chest: Effort normal and breath sounds normal. No respiratory distress.  Abdominal: Soft. There is no tenderness.  Musculoskeletal: He exhibits no edema.  Neurological: He is alert.  Skin: Skin is warm and dry. Capillary refill takes less than 2 seconds.  Psychiatric: He has a normal mood and affect.  Nursing note and vitals reviewed.   Urgent Care Course     Procedures (including critical care time)  Labs Review Labs Reviewed - No data to display  Imaging Review No results found.   Visual Acuity Review  Right Eye Distance:   Left Eye Distance:   Bilateral Distance:    Right Eye Near:   Left Eye Near:    Bilateral Near:         MDM   1. Viral pharyngitis     You most likely have viral pharyngitis. You have been tested for strep throat and are negative, the sample  will be sent for culture to confirm. I have sent a steroid to your pharmacy that will help with the swelling in your throat. Take 6 tablets today then decrease by 1 each day till finished (6,5,4,3,2,1). I have also sent in a medicine for cough called Tessalon, take 1 tablet every 8 hours as needed. You may take tylenol every 4-6 hours for pain relief, or Chloraseptic throat spray or lozenges. Should your symptoms fail to resolve or worsen, follow up with your primary care provider or return to clinic.     Barnet Glasgow, NP 08/11/16 1358

## 2016-08-14 LAB — CULTURE, GROUP A STREP (THRC)

## 2019-05-13 ENCOUNTER — Other Ambulatory Visit: Payer: Self-pay

## 2019-05-13 DIAGNOSIS — Z20822 Contact with and (suspected) exposure to covid-19: Secondary | ICD-10-CM

## 2019-05-14 LAB — NOVEL CORONAVIRUS, NAA: SARS-CoV-2, NAA: DETECTED — AB
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
## 2021-03-13 ENCOUNTER — Ambulatory Visit: Payer: Self-pay

## 2021-03-13 ENCOUNTER — Ambulatory Visit (INDEPENDENT_AMBULATORY_CARE_PROVIDER_SITE_OTHER): Payer: 59

## 2021-03-13 ENCOUNTER — Ambulatory Visit (INDEPENDENT_AMBULATORY_CARE_PROVIDER_SITE_OTHER): Payer: 59 | Admitting: Family Medicine

## 2021-03-13 ENCOUNTER — Other Ambulatory Visit: Payer: Self-pay

## 2021-03-13 VITALS — BP 120/78 | HR 81 | Ht 74.0 in | Wt 220.0 lb

## 2021-03-13 DIAGNOSIS — M1611 Unilateral primary osteoarthritis, right hip: Secondary | ICD-10-CM

## 2021-03-13 DIAGNOSIS — M25551 Pain in right hip: Secondary | ICD-10-CM

## 2021-03-13 NOTE — Patient Instructions (Signed)
Good to see you.  Ice 20 minutes 2 times daily. Usually after activity and before bed. Exercises 3 times a week.  Turmeric '500mg'$  daily  Tart cherry extract '1200mg'$  at night Vitamin D 2000 IU daily  See me again in 6 weeks

## 2021-03-14 DIAGNOSIS — M1611 Unilateral primary osteoarthritis, right hip: Secondary | ICD-10-CM | POA: Insufficient documentation

## 2021-03-14 NOTE — Assessment & Plan Note (Signed)
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 IM rods appear stable.  Discussed HEP discussed which activities  Discussed OTC vitamins. Icing RTC in 6-8 weeks

## 2021-03-19 ENCOUNTER — Other Ambulatory Visit: Payer: Self-pay

## 2021-03-19 ENCOUNTER — Telehealth: Payer: Self-pay | Admitting: Family Medicine

## 2021-03-19 DIAGNOSIS — M1611 Unilateral primary osteoarthritis, right hip: Secondary | ICD-10-CM

## 2021-03-19 NOTE — Telephone Encounter (Signed)
Referral placed. Patient notified. 

## 2021-03-19 NOTE — Telephone Encounter (Signed)
Pt called, he wanted to get Dr. Thompson Caul opinion on starting to move forward with the plan for hip replacement in 2023. It would be best financially for pt to have all of this done in 2023.  Could we go ahead and refer him to a surgeon so he could have a consult and determine a plan for surgery next year?

## 2021-04-25 ENCOUNTER — Ambulatory Visit: Payer: 59 | Admitting: Family Medicine

## 2021-05-13 ENCOUNTER — Encounter: Payer: Self-pay | Admitting: Gastroenterology

## 2021-06-28 ENCOUNTER — Ambulatory Visit (AMBULATORY_SURGERY_CENTER): Payer: 59 | Admitting: *Deleted

## 2021-06-28 ENCOUNTER — Other Ambulatory Visit: Payer: Self-pay

## 2021-06-28 VITALS — Ht 74.0 in | Wt 220.0 lb

## 2021-06-28 DIAGNOSIS — Z1211 Encounter for screening for malignant neoplasm of colon: Secondary | ICD-10-CM

## 2021-06-28 MED ORDER — PEG 3350-KCL-NA BICARB-NACL 420 G PO SOLR: 4000.0000 mL | Freq: Once | ORAL | 0 refills | Status: AC

## 2021-06-28 NOTE — Progress Notes (Signed)
Patient's pre-visit was done today over the phone with the patient. Name,DOB and address verified. Patient denies any allergies to Eggs and Soy. Patient denies any problems with anesthesia/sedation. Patient is not taking any diet pills or blood thinners. No home Oxygen. Packet of Prep instructions mailed to patient including a copy of a consent form-pt is aware. Prep instructions sent to pt's MyChart (if activated).Patient understands to call us back with any questions or concerns. Patient is aware of our care-partner policy and Covid-19 safety protocol.  ° °EMMI education assigned to the patient for the procedure, sent to MyChart.  ° °The patient is COVID-19 vaccinated.   °

## 2021-07-10 ENCOUNTER — Other Ambulatory Visit: Payer: Self-pay

## 2021-07-10 ENCOUNTER — Encounter: Payer: Self-pay | Admitting: Gastroenterology

## 2021-07-10 ENCOUNTER — Ambulatory Visit (AMBULATORY_SURGERY_CENTER): Payer: 59 | Admitting: Gastroenterology

## 2021-07-10 VITALS — BP 120/90 | HR 79 | Temp 98.6°F | Resp 12 | Ht 74.0 in | Wt 220.0 lb

## 2021-07-10 DIAGNOSIS — Z1211 Encounter for screening for malignant neoplasm of colon: Secondary | ICD-10-CM

## 2021-07-10 DIAGNOSIS — D120 Benign neoplasm of cecum: Secondary | ICD-10-CM

## 2021-07-10 MED ORDER — SODIUM CHLORIDE 0.9 % IV SOLN: 500.0000 mL | Freq: Once | INTRAVENOUS | Status: AC

## 2021-07-10 NOTE — Progress Notes (Signed)
[facility] History and Physical   Primary Care Physician:  Bernerd Limbo, MD   Reason for Procedure:   Colon cancer screening  Plan:    colonoscopy     HPI: Seth [name] is a 50 y.o. male  here for colonoscopy screening - first time exam. Patient denies any bowel symptoms at this time. No family history of colon cancer known. Otherwise feels well without any cardiopulmonary symptoms.    History reviewed. No pertinent past medical history. Patient states healthy  Past Surgical History:  Procedure Laterality Date   FRACTURE SURGERY     shoulder   HERNIA REPAIR     HIP FRACTURE SURGERY     ORIF FINGER / THUMB FRACTURE      Prior to Admission medications   Medication Sig Start Date End Date Taking? Authorizing Provider  Cholecalciferol (VITAMIN D3 PO) Take by mouth.    [provider]  Multiple Vitamin (MULTIVITAMIN) tablet Take 1 tablet by mouth daily.    [provider]  TURMERIC PO Take by mouth.    [provider]    Current Outpatient Medications  Medication Sig Dispense Refill   Cholecalciferol (VITAMIN D3 PO) Take by mouth.     Multiple Vitamin (MULTIVITAMIN) tablet Take 1 tablet by mouth daily.     TURMERIC PO Take by mouth.     Current Facility-Administered Medications  Medication Dose Route Frequency Provider Last Rate Last Admin   0.9 %  sodium chloride infusion  500 mL Intravenous Once Salvador Bigbee, Carlota Raspberry, MD        Allergies as of 07/10/2021   (No Known Allergies)    Family History  Problem Relation Age of Onset   Colon cancer Neg Hx     Social History   Socioeconomic History   Marital status: Single    Spouse name: n/a   Number of children: 0   Years of education: Not on file   Highest education level: Not on file  Occupational History   Occupation: Art and Psychologist, forensic    Comment: [employer]  Tobacco Use   Smoking status: Former    Packs/day: 1.00    Years: 10.00    Pack years: 10.00     Types: Cigarettes    Quit date: 08/31/2010    Years since quitting: 10.8   Smokeless tobacco: Never  Vaping Use   Vaping Use: Never used  Substance and Sexual Activity   Alcohol use: Yes    Alcohol/week: 5.0 - 10.0 standard drinks    Types: 5 - 10 Standard drinks or equivalent per week   Drug use: No   Sexual activity: Not on file  Other Topics Concern   Not on file  Social History Narrative   Lives with his brother Seth [name].   Social Determinants of Health   Financial Resource Strain: Not on file  Food Insecurity: Not on file  Transportation Needs: Not on file  Physical Activity: Not on file  Stress: Not on file  Social Connections: Not on file  Intimate Partner Violence: Not on file    Review of Systems: All other review of systems negative except as mentioned in the HPI.  Physical Exam: Vital signs BP (!) 152/93    Pulse 77    Temp 98.6 F (37 C)    Ht 6\' 2"  (1.88 m)    Wt 220 lb (99.8 kg)    SpO2 98%    BMI 28.25 kg/m   General:   Alert,  Well-developed, pleasant and cooperative in NAD Lungs:  Clear throughout to auscultation.   Heart:  Regular rate and rhythm Abdomen:  Soft, nontender and nondistended.   Neuro/Psych:  Alert and cooperative. Normal mood and affect. A and O x 3  Jolly Mango, MD [facility]

## 2021-07-10 NOTE — Patient Instructions (Signed)
Handouts on Polyps and Hemorrhoids given.  YOU HAD AN ENDOSCOPIC PROCEDURE TODAY AT [facility]:   Refer to the procedure report that was given to you for any specific questions about what was found during the examination.  If the procedure report does not answer your questions, please call your gastroenterologist to clarify.  If you requested that your care partner not be given the details of your procedure findings, then the procedure report has been included in a sealed envelope for you to review at your convenience later.  YOU SHOULD EXPECT: Some feelings of bloating in the abdomen. Passage of more gas than usual.  Walking can help get rid of the air that was put into your GI tract during the procedure and reduce the bloating. If you had a lower endoscopy (such as a colonoscopy or flexible sigmoidoscopy) you may notice spotting of blood in your stool or on the toilet paper. If you underwent a bowel prep for your procedure, you may not have a normal bowel movement for a few days.  Please Note:  You might notice some irritation and congestion in your nose or some drainage.  This is from the oxygen used during your procedure.  There is no need for concern and it should clear up in a day or so.  SYMPTOMS TO REPORT IMMEDIATELY:  Following lower endoscopy (colonoscopy or flexible sigmoidoscopy):  Excessive amounts of blood in the stool  Significant tenderness or worsening of abdominal pains  Swelling of the abdomen that is new, acute  Fever of 100F or higher  For urgent or emergent issues, a gastroenterologist can be reached at any hour by calling [phone]. Do not use MyChart messaging for urgent concerns.    DIET:  We do recommend a small meal at first, but then you may proceed to your regular diet.  Drink plenty of fluids but you should avoid alcoholic beverages for 24 hours.  ACTIVITY:  You should plan to take it easy for the rest of today and you should NOT DRIVE or  use heavy machinery until tomorrow (because of the sedation medicines used during the test).    FOLLOW UP: Our staff will call the number listed on your records 48-72 hours following your procedure to check on you and address any questions or concerns that you may have regarding the information given to you following your procedure. If we do not reach you, we will leave a message.  We will attempt to reach you two times.  During this call, we will ask if you have developed any symptoms of COVID 19. If you develop any symptoms (ie: fever, flu-like symptoms, shortness of breath, cough etc.) before then, please call [phone].  If you test positive for Covid 19 in the 2 weeks post procedure, please call and report this information to [place].    If any biopsies were taken you will be contacted by phone or by letter within the next 1-3 weeks.  Please call us at [phone] if you have not heard about the biopsies in 3 weeks.    SIGNATURES/CONFIDENTIALITY: You and/or your care partner have signed paperwork which will be entered into your electronic medical record.  These signatures attest to the fact that that the information above on your After Visit Summary has been reviewed and is understood.  Full responsibility of the confidentiality of this discharge information lies with you and/or your care-partner.

## 2021-07-10 NOTE — Op Note (Addendum)
Seth [name] Patient Name: Seth [name] Procedure Date: 07/10/2021 10:38 AM MRN: [id number] Endoscopist: Remo Lipps P. Havery Moros , MD Age: 50 Referring MD:  Date of Birth: 03-24-1971 Gender: Male Account #: [id number] Procedure:                Colonoscopy Indications:              Screening for colorectal malignant neoplasm, This                            is the patient's first colonoscopy Medicines:                Monitored Anesthesia Care Procedure:                Pre-Anesthesia Assessment:                           - Prior to the procedure, a History and Physical                            was performed, and patient medications and                            allergies were reviewed. The patient's tolerance of                            previous anesthesia was also reviewed. The risks                            and benefits of the procedure and the sedation                            options and risks were discussed with the patient.                            All questions were answered, and informed consent                            was obtained. Prior Anticoagulants: The patient has                            taken no previous anticoagulant or antiplatelet                            agents. ASA Grade Assessment: II - A patient with                            severe systemic disease. After reviewing the risks                            and benefits, the patient was deemed in                            satisfactory condition to undergo the procedure.  After obtaining informed consent, the colonoscope                            was passed under direct vision. Throughout the                            procedure, the patient's blood pressure, pulse, and                            oxygen saturations were monitored continuously. The                            Colonoscope was introduced through the anus and                            advanced to the the  cecum, identified by                            appendiceal orifice and ileocecal valve. The                            colonoscopy was performed without difficulty. The                            patient tolerated the procedure well. The quality                            of the bowel preparation was adequate. The                            ileocecal valve, appendiceal orifice, and rectum                            were photographed. Scope In: 10:44:49 AM Scope Out: 11:06:58 AM Scope Withdrawal Time: 0 hours 19 minutes 34 seconds  Total Procedure Duration: 0 hours 22 minutes 9 seconds  Findings:                 The perianal and digital rectal examinations were                            normal.                           A diminutive polyp was found in the cecum. The                            polyp was sessile. The polyp was removed with a                            cold snare. Resection and retrieval were complete.                           Internal hemorrhoids were found during retroflexion.  Anal papilla(e) were hypertrophied.                           The exam was otherwise without abnormality. Complications:            No immediate complications. Estimated blood loss:                            Minimal. Estimated Blood Loss:     Estimated blood loss was minimal. Impression:               - One diminutive polyp in the cecum, removed with a                            cold snare. Resected and retrieved.                           - Internal hemorrhoids.                           - Anal papilla(e) were hypertrophied.                           - The examination was otherwise normal. Recommendation:           - Patient has a contact number available for                            emergencies. The signs and symptoms of potential                            delayed complications were discussed with the                            patient. Return to normal activities  tomorrow.                            Written discharge instructions were provided to the                            patient.                           - Resume previous diet.                           - Continue present medications.                           - Await pathology results. Remo Lipps P. [name], MD 07/10/2021 11:10:13 AM This report has been signed electronically.

## 2021-07-10 NOTE — Progress Notes (Signed)
Called to room to assist during endoscopic procedure.  Patient ID and intended procedure confirmed with present staff. Received instructions for my participation in the procedure from the performing physician.  

## 2021-07-10 NOTE — Progress Notes (Signed)
To PACU, VSS. Report to [email or web address] 

## 2021-07-10 NOTE — Progress Notes (Signed)
VS by CW  Pt's states no medical or surgical changes since previsit or office visit.  

## 2021-07-12 ENCOUNTER — Telehealth: Payer: Self-pay | Admitting: *Deleted

## 2021-07-12 ENCOUNTER — Encounter: Payer: 59 | Admitting: Gastroenterology

## 2021-07-12 NOTE — Telephone Encounter (Signed)
°  Follow up Call-  Call back number 07/10/2021  Post procedure Call Back phone  # [phone]  Permission to leave phone message Yes  Some recent data might be hidden     Patient questions:  Do you have a fever, pain , or abdominal swelling? No. Pain Score  0 *  Have you tolerated food without any problems? Yes.    Have you been able to return to your normal activities? Yes.    Do you have any questions about your discharge instructions: Diet   No. Medications  No. Follow up visit  No.  Do you have questions or concerns about your Care? No.  Actions: * If pain score is 4 or above: No action needed, pain <4.  Have you developed a fever since your procedure? no  2.   Have you had an respiratory symptoms (SOB or cough) since your procedure? no  3.   Have you tested positive for COVID 19 since your procedure no  4.   Have you had any family members/close contacts diagnosed with the COVID 19 since your procedure?  no   If yes to any of these questions please route to Joylene John, RN and Joella Prince, RN

## 2021-07-12 NOTE — Telephone Encounter (Signed)
°  Follow up Call-  Call back number 07/10/2021  Post procedure Call Back phone  # [phone]  Permission to leave phone message Yes  Some recent data might be hidden   [facility]

## 2021-08-02 ENCOUNTER — Ambulatory Visit (HOSPITAL_COMMUNITY)
Admission: EM | Admit: 2021-08-02 | Discharge: 2021-08-02 | Disposition: A | Discharge status: Home / Self Care | Payer: 59 | Attending: Urgent Care | Admitting: Urgent Care

## 2021-08-02 ENCOUNTER — Encounter (HOSPITAL_COMMUNITY): Payer: Self-pay | Admitting: *Deleted

## 2021-08-02 ENCOUNTER — Other Ambulatory Visit: Payer: Self-pay

## 2021-08-02 DIAGNOSIS — J029 Acute pharyngitis, unspecified: Secondary | ICD-10-CM | POA: Diagnosis present

## 2021-08-02 DIAGNOSIS — R067 Sneezing: Secondary | ICD-10-CM | POA: Diagnosis present

## 2021-08-02 LAB — POCT RAPID STREP A, ED / UC: Streptococcus, Group A Screen (Direct): NEGATIVE

## 2021-08-02 LAB — POCT INFECTIOUS MONO SCREEN, ED / UC: Mono Screen: NEGATIVE

## 2021-08-02 MED ORDER — CETIRIZINE HCL 10 MG PO TABS: 10.0000 mg | ORAL_TABLET | Freq: Every day | ORAL | 0 refills | Status: AC

## 2021-08-02 MED ORDER — PSEUDOEPHEDRINE HCL 30 MG PO TABS: 30.0000 mg | ORAL_TABLET | Freq: Three times a day (TID) | ORAL | 0 refills | Status: AC | PRN

## 2021-08-02 NOTE — ED Triage Notes (Signed)
Pt reports sore throat and throat swelling started on Monday.

## 2021-08-02 NOTE — ED Provider Notes (Signed)
[facility]   MRN: [id number] DOB: 1971-07-13  Subjective:   Seth [name] is a 51 y.o. male presenting for 5 day history of acute onset throat pain, painful swallowing, lymph node swelling.  Patient has also been sneezing.  No ear pain, cough, chest pain, shortness of breath or wheezing, nausea, vomiting, abdominal pain.  Patient had COVID 2 months ago, does not want to be tested for this.  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   Current Facility-Administered Medications:    0.9 %  sodium chloride infusion, 500 mL, Intravenous, Once, Armbruster, Carlota Raspberry, MD  Current Outpatient Medications:    Multiple Vitamin (MULTIVITAMIN) tablet, Take 1 tablet by mouth daily., Disp: , Rfl:    TURMERIC PO, Take by mouth., Disp: , Rfl:    No Known Allergies  No past medical history on file.   Past Surgical History:  Procedure Laterality Date   FRACTURE SURGERY     shoulder   HERNIA REPAIR     HIP FRACTURE SURGERY     ORIF FINGER / THUMB FRACTURE      Family History  Problem Relation Age of Onset   Colon cancer Neg Hx     Social History   Tobacco Use   Smoking status: Former    Packs/day: 1.00    Years: 10.00    Pack years: 10.00    Types: Cigarettes    Quit date: 08/31/2010    Years since quitting: 10.9   Smokeless tobacco: Never  Vaping Use   Vaping Use: Never used  Substance Use Topics   Alcohol use: Yes    Alcohol/week: 5.0 - 10.0 standard drinks    Types: 5 - 10 Standard drinks or equivalent per week   Drug use: No    ROS   Objective:   Vitals: BP (!) 139/59    Pulse 97    Temp 98.7 F (37.1 C)    Resp 18    SpO2 98%   Physical Exam Constitutional:      General: He is not in acute distress.    Appearance: Normal appearance. He is well-developed and normal weight. He is not ill-appearing, toxic-appearing or diaphoretic.  HENT:     Head: Normocephalic and atraumatic.     Right Ear: External ear normal. There is no  impacted cerumen.     Left Ear: External ear normal. There is no impacted cerumen.     Nose: Nose normal.     Mouth/Throat:     Mouth: Mucous membranes are moist.     Pharynx: No pharyngeal swelling, oropharyngeal exudate, posterior oropharyngeal erythema or uvula swelling.     Tonsils: No tonsillar exudate or tonsillar abscesses. 0 on the right. 0 on the left.     Comments: Chronic tonsillar hypertrophy worse over the left.  Significant postnasal drainage and cobblestone pattern overlying pharynx. Eyes:     General: No scleral icterus.       Right eye: No discharge.        Left eye: No discharge.     Extraocular Movements: Extraocular movements intact.     Conjunctiva/sclera: Conjunctivae normal.  Cardiovascular:     Rate and Rhythm: Normal rate.  Pulmonary:     Effort: Pulmonary effort is normal.  Musculoskeletal:     Cervical back: Normal range of motion and neck supple. No rigidity. No muscular tenderness.  Neurological:     General: No focal deficit present.     Mental Status:  He is alert and oriented to person, place, and time.  Psychiatric:        Mood and Affect: Mood normal.        Behavior: Behavior normal.        Thought Content: Thought content normal.        Judgment: Judgment normal.    Results for orders placed or performed during the hospital encounter of 08/02/21 (from the past 24 hour(s))  POCT Rapid Strep A     Status: None   Collection Time: 08/02/21  4:51 PM  Result Value Ref Range   Streptococcus, Group A Screen (Direct) NEGATIVE NEGATIVE  POCT Infectious Mono Screen     Status: None   Collection Time: 08/02/21  5:14 PM  Result Value Ref Range   Mono Screen NEGATIVE NEGATIVE    Assessment and Plan :   PDMP not reviewed this encounter.  1. Viral pharyngitis   2. Sneezing    Strep culture pending.  Recommended conservative management, supportive care for viral pharyngitis. Counseled patient on potential for adverse effects with medications  prescribed/recommended today, ER and return-to-clinic precautions discussed, patient verbalized understanding.    Jaynee Eagles, PA-C 08/02/21 1724

## 2021-08-05 LAB — CULTURE, GROUP A STREP (THRC)
